# Patient Record
Sex: Male | Born: 1995 | Race: White | Hispanic: No | Marital: Single | Smoking: Current every day smoker
Health system: Southern US, Community
[De-identification: ages and names within clinical notes are randomized; demographics above are authoritative.]

## PROBLEM LIST (undated history)

## (undated) DIAGNOSIS — J45909 Unspecified asthma, uncomplicated: Secondary | ICD-10-CM

## (undated) DIAGNOSIS — M1990 Unspecified osteoarthritis, unspecified site: Secondary | ICD-10-CM

## (undated) DIAGNOSIS — R454 Irritability and anger: Secondary | ICD-10-CM

## (undated) DIAGNOSIS — F909 Attention-deficit hyperactivity disorder, unspecified type: Secondary | ICD-10-CM

## (undated) DIAGNOSIS — F329 Major depressive disorder, single episode, unspecified: Secondary | ICD-10-CM

## (undated) DIAGNOSIS — F32A Depression, unspecified: Secondary | ICD-10-CM

---
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
